# Patient Record
Sex: Male | Born: 1980 | Hispanic: No | Marital: Single
Health system: Southern US, Community
[De-identification: ages and names within clinical notes are randomized; demographics above are authoritative.]

## PROBLEM LIST (undated history)

## (undated) DIAGNOSIS — F640 Transsexualism: Secondary | ICD-10-CM

## (undated) DIAGNOSIS — M109 Gout, unspecified: Secondary | ICD-10-CM

## (undated) DIAGNOSIS — Z789 Other specified health status: Secondary | ICD-10-CM

## (undated) HISTORY — PX: INNER EAR SURGERY: SHX679

---

## 2001-09-16 ENCOUNTER — Emergency Department (HOSPITAL_COMMUNITY): Admission: EM | Admit: 2001-09-16 | Discharge: 2001-09-16 | Payer: Self-pay

## 2017-02-13 ENCOUNTER — Emergency Department (HOSPITAL_COMMUNITY)
Admission: EM | Admit: 2017-02-13 | Discharge: 2017-02-14 | Disposition: A | Discharge status: Home / Self Care | Payer: Self-pay | Attending: Emergency Medicine | Admitting: Emergency Medicine

## 2017-02-13 ENCOUNTER — Other Ambulatory Visit: Payer: Self-pay

## 2017-02-13 ENCOUNTER — Encounter (HOSPITAL_COMMUNITY): Payer: Self-pay | Admitting: Emergency Medicine

## 2017-02-13 DIAGNOSIS — M545 Low back pain: Secondary | ICD-10-CM | POA: Insufficient documentation

## 2017-02-13 DIAGNOSIS — M109 Gout, unspecified: Secondary | ICD-10-CM | POA: Insufficient documentation

## 2017-02-13 DIAGNOSIS — M5432 Sciatica, left side: Secondary | ICD-10-CM | POA: Insufficient documentation

## 2017-02-13 DIAGNOSIS — Z79899 Other long term (current) drug therapy: Secondary | ICD-10-CM | POA: Insufficient documentation

## 2017-02-13 LAB — URINALYSIS, ROUTINE W REFLEX MICROSCOPIC
Bilirubin Urine: NEGATIVE
Ketones, ur: 20 mg/dL — AB
RBC / HPF: NONE SEEN RBC/hpf (ref 0–5)
Specific Gravity, Urine: 1.009 (ref 1.005–1.030)

## 2017-02-13 NOTE — ED Triage Notes (Signed)
Pt reports his gout has flared up, both ankles/feet are swollen.  Pt reports pain is so great that he is unable to walk.  Pt reports that he has developed the lower back pain over the past 3days.  Urine has been very dark, no loss of bladder or bowels or pain upon urination.

## 2017-02-14 LAB — BASIC METABOLIC PANEL
Anion gap: 10 (ref 5–15)
BUN: 6 mg/dL (ref 6–20)
CO2: 23 mmol/L (ref 22–32)
Chloride: 97 mmol/L — ABNORMAL LOW (ref 101–111)
Glucose, Bld: 109 mg/dL — ABNORMAL HIGH (ref 65–99)
Potassium: 3.4 mmol/L — ABNORMAL LOW (ref 3.5–5.1)

## 2017-02-14 LAB — CBC WITH DIFFERENTIAL/PLATELET
Basophils Relative: 0 %
HCT: 36.6 % (ref 36.0–46.0)
Hemoglobin: 12.3 g/dL (ref 12.0–15.0)
Lymphocytes Relative: 10 %
Lymphs Abs: 0.6 10*3/uL — ABNORMAL LOW (ref 0.7–4.0)
MCH: 35.2 pg — ABNORMAL HIGH (ref 26.0–34.0)
MCHC: 33.6 g/dL (ref 30.0–36.0)
MCV: 104.9 fL — ABNORMAL HIGH (ref 78.0–100.0)
Monocytes Absolute: 1.1 10*3/uL — ABNORMAL HIGH (ref 0.1–1.0)
Monocytes Relative: 17 %
Neutro Abs: 4.9 10*3/uL (ref 1.7–7.7)
Neutrophils Relative %: 73 %
RDW: 13.4 % (ref 11.5–15.5)

## 2017-02-14 MED ORDER — COLCHICINE 0.6 MG PO TABS
1.2000 mg | ORAL_TABLET | Freq: Once | ORAL | Status: AC
  Administered 2017-02-14: 1.2 mg via ORAL
  Filled 2017-02-14: qty 2

## 2017-02-14 MED ORDER — OXYCODONE-ACETAMINOPHEN 5-325 MG PO TABS
1.0000 | ORAL_TABLET | Freq: Once | ORAL | Status: AC
  Administered 2017-02-14: 1 via ORAL
  Filled 2017-02-14: qty 1

## 2017-02-14 MED ORDER — OXYCODONE-ACETAMINOPHEN 5-325 MG PO TABS: 1.0000 | ORAL_TABLET | ORAL | 0 refills | Status: AC | PRN

## 2017-02-14 MED ORDER — COLCHICINE 0.6 MG PO TABS
0.6000 mg | ORAL_TABLET | Freq: Once | ORAL | Status: AC
  Administered 2017-02-14: 0.6 mg via ORAL
  Filled 2017-02-14: qty 1

## 2017-02-14 MED ORDER — SODIUM CHLORIDE 0.9 % IV BOLUS (SEPSIS): 1000.0000 mL | Freq: Once | INTRAVENOUS | Status: DC

## 2017-02-14 MED ORDER — INDOMETHACIN 25 MG PO CAPS: 25.0000 mg | ORAL_CAPSULE | Freq: Three times a day (TID) | ORAL | 0 refills | Status: AC | PRN

## 2017-02-14 NOTE — ED Provider Notes (Signed)
[facility] Provider [facility]   CSN: [id number] Arrival date & time: 02/13/17  2109     History   Chief Complaint Chief Complaint  Patient presents with   Gout   Back Pain    HPI Elijah BirkDevon L Weinfeld is a 36 y.o. male.  Patient with a history of gout presents with bilateral foot pain and redness consistent with previous episodes. No fever. He states that he is having trouble walking because of the pain. He also reports lower back pain that he feels is muscular from lying down so much over the last 3 days, and that his urine appears darker than usual without dysuria or hematuria.   The history is provided by the patient. No language interpreter was used.    History reviewed. No pertinent past medical history.  There are no active problems to display for this patient.   History reviewed. No pertinent surgical history.  OB History    No data available       Home Medications    Prior to Admission medications   Medication Sig Start Date End Date Taking? Authorizing Provider  testosterone cypionate (DEPOTESTOTERONE CYPIONATE) 100 MG/ML injection Inject 100 mg into the muscle every 14 (fourteen) days. For IM use only   Yes [provider]  indomethacin (INDOCIN) 25 MG capsule Take 1 capsule (25 mg total) by mouth 3 (three) times daily as needed. 02/14/17   Elpidio AnisUpstill, Avarey Yaeger, PA-C  oxyCODONE-acetaminophen (PERCOCET/ROXICET) 5-325 MG tablet Take 1 tablet by mouth every 4 (four) hours as needed for severe pain. 02/14/17   Elpidio AnisUpstill, Samary Shatz, PA-C    Family History No family history on file.  Social History Social History   Tobacco Use   Smoking status: Not on file  Substance Use Topics   Alcohol use: Not on file   Drug use: Not on file     Allergies   Penicillins   Review of Systems Review of Systems  Constitutional: Positive for activity change. Negative for fever.  Respiratory: Negative for shortness of breath.    Cardiovascular: Negative for chest pain.  Gastrointestinal: Negative for abdominal pain and nausea.  Genitourinary: Negative for dysuria and hematuria.  Musculoskeletal: Positive for back pain.  Skin: Positive for color change.  Neurological: Negative for numbness.     Physical Exam Updated Vital Signs BP (!) 134/103 (BP Location: Right Arm)    Pulse 91    Temp 98.2 F (36.8 C) (Oral)    Resp (!) 28    Ht 5\' 6"  (1.676 m)    Wt 66.7 kg (147 lb)    SpO2 99%    BMI 23.73 kg/m   Physical Exam  Constitutional: She appears well-developed and well-nourished.  HENT:  Head: Normocephalic.  Neck: Normal range of motion. Neck supple.  Cardiovascular: Normal rate and regular rhythm.  Pulmonary/Chest: Effort normal and breath sounds normal. She has no wheezes. She has no rales.  Abdominal: Soft. Bowel sounds are normal. There is no tenderness. There is no rebound and no guarding.  Genitourinary:  Genitourinary Comments: No CVA tenderness.   Musculoskeletal: Normal range of motion.  There is redness and tenderness to bilateral 1st MTP joints. Redness extends into R>L dorsal foot and ankles. No calf tenderness or swelling. Lower back is minimally tender without swelling with greater tenderness to left sciatic.   Neurological: She is alert. She displays normal reflexes.  Skin: Skin is warm and dry. No rash noted.  Psychiatric: She has a normal mood and  affect.     ED Treatments / Results  Labs (all labs ordered are listed, but only abnormal results are displayed) Labs Reviewed  URINALYSIS, ROUTINE W REFLEX MICROSCOPIC - Abnormal; Notable for the following components:      Result Value   Ketones, ur 20 (*)    Leukocytes, UA TRACE (*)    Squamous Epithelial / LPF 0-5 (*)    All other components within normal limits  BASIC METABOLIC PANEL - Abnormal; Notable for the following components:   Sodium 130 (*)    Potassium 3.4 (*)    Chloride 97 (*)    Glucose, Bld 109 (*)    All other  components within normal limits  CBC WITH DIFFERENTIAL/PLATELET - Abnormal; Notable for the following components:   RBC 3.49 (*)    MCV 104.9 (*)    MCH 35.2 (*)    Lymphs Abs 0.6 (*)    Monocytes Absolute 1.1 (*)    All other components within normal limits  URIC ACID    EKG  EKG Interpretation None       Radiology No results found.  Procedures Procedures (including critical care time)  Medications Ordered in ED Medications  sodium chloride 0.9 % bolus 1,000 mL (1,000 mLs Intravenous Refused 02/14/17 0050)  colchicine tablet 0.6 mg (not administered)  oxyCODONE-acetaminophen (PERCOCET/ROXICET) 5-325 MG per tablet 1 tablet (1 tablet Oral Given 02/14/17 0045)  colchicine tablet 1.2 mg (1.2 mg Oral Given 02/14/17 0045)     Initial Impression / Assessment and Plan / ED Course  I have reviewed the triage vital signs and the nursing notes.  Pertinent labs & imaging results that were available during my care of the patient were reviewed by me and considered in my medical decision making (see chart for details).     Patient with a history of gout presents with symptoms familiar to him as a flare up. No fever. He reports being unable to walk secondary to pain for several days.   Also has low back pain and left sciatica without neurologic deficits.   Labs are unremarkable, no evidence of infection. Pain is improved with Percocet. He is felt stable for discharge home. He is given colchicine for gout flare and states he feels better. Discharge home with indocin and percoct. Referrals for primary care provided.   Final Clinical Impressions(s) / ED Diagnoses   Final diagnoses:  Gout, arthritis  Sciatica of left side    ED Discharge Orders        Ordered    oxyCODONE-acetaminophen (PERCOCET/ROXICET) 5-325 MG tablet  Every 4 hours PRN     02/14/17 0146    indomethacin (INDOCIN) 25 MG capsule  3 times daily PRN     02/14/17 0146       Elpidio AnisUpstill, Adren Dollins, PA-C 02/20/17  [id number]    Glynn Octaveancour, Stephen, MD 02/26/17 1736

## 2017-05-25 ENCOUNTER — Encounter (HOSPITAL_COMMUNITY): Payer: Self-pay | Admitting: Emergency Medicine

## 2017-05-25 ENCOUNTER — Other Ambulatory Visit: Payer: Self-pay

## 2017-05-25 ENCOUNTER — Emergency Department (HOSPITAL_COMMUNITY): Payer: Self-pay

## 2017-05-25 DIAGNOSIS — R079 Chest pain, unspecified: Secondary | ICD-10-CM | POA: Insufficient documentation

## 2017-05-25 LAB — BASIC METABOLIC PANEL
Anion gap: 14 (ref 5–15)
BUN: 6 mg/dL (ref 6–20)
CHLORIDE: 105 mmol/L (ref 101–111)
CO2: 21 mmol/L — AB (ref 22–32)
Calcium: 9.5 mg/dL (ref 8.9–10.3)
Creatinine, Ser: 0.69 mg/dL (ref 0.61–1.24)
GFR calc Af Amer: >60 mL/min (ref >60)
GFR calc non Af Amer: >60 mL/min (ref >60)
Glucose, Bld: 96 mg/dL (ref 65–99)
POTASSIUM: 4.1 mmol/L (ref 3.5–5.1)
SODIUM: 140 mmol/L (ref 135–145)

## 2017-05-25 LAB — CBC
HEMATOCRIT: 42.3 % (ref 39.0–52.0)
Hemoglobin: 14.4 g/dL (ref 13.0–17.0)
MCH: 34.1 pg — AB (ref 26.0–34.0)
MCHC: 34 g/dL (ref 30.0–36.0)
MCV: 100.2 fL — AB (ref 78.0–100.0)
Platelets: 125 10*3/uL — ABNORMAL LOW (ref 150–400)
RBC: 4.22 MIL/uL (ref 4.22–5.81)
RDW: 12.6 % (ref 11.5–15.5)
WBC: 3.5 10*3/uL — AB (ref 4.0–10.5)

## 2017-05-25 LAB — I-STAT TROPONIN, ED: Troponin i, poc: 0 ng/mL (ref 0.00–0.08)

## 2017-05-25 NOTE — ED Triage Notes (Signed)
Pt reports chest pain that starts on the right side of his chest and over to his left side.  Denies n/v/d, dizziness or sob.  Pt reports his body is psoriasis has flared up to 70% of his body.  He is concerned he had a stroke??  Reports anxiety

## 2017-05-26 ENCOUNTER — Emergency Department (HOSPITAL_COMMUNITY)
Admission: EM | Admit: 2017-05-26 | Discharge: 2017-05-26 | Disposition: A | Discharge status: Home / Self Care | Payer: Self-pay | Attending: Emergency Medicine | Admitting: Emergency Medicine

## 2017-05-26 DIAGNOSIS — R079 Chest pain, unspecified: Secondary | ICD-10-CM

## 2017-05-26 HISTORY — DX: Gout, unspecified: M10.9

## 2017-05-26 NOTE — ED Provider Notes (Signed)
[facility] EMERGENCY DEPARTMENT Provider Note   CSN: [id number] Arrival date & time: 05/25/17  2011     History   Chief Complaint Chief Complaint  Patient presents with  . Chest Pain    HPI Louis Colon is a 37 y.o. male past medical history of gout and psoriasis presenting with 5 days of constant sharp initially right-sided than mild left-sided chest pain non- radiating.  Patient reports this being aggravated with deep breaths and movement of the chest wall.  Patient has not tried anything for this pain.  Reports soreness to his chest wall.  Denies any cough, fever, chills, nausea, vomiting. Denies any recent travel, prolonged immobilization, surgery, history of DVT/PE, hemoptysis, shortness of breath.  Patient denies any drug use but admits to alcohol use approximately 4 beers per day.  Patient is a non-smoker.   HPI  Past Medical History:  Diagnosis Date  . Gout     There are no active problems to display for this patient.   History reviewed. No pertinent surgical history.      Home Medications    Prior to Admission medications   Medication Sig Start Date End Date Taking? Authorizing Provider  testosterone cypionate (DEPOTESTOTERONE CYPIONATE) 100 MG/ML injection Inject 100 mg into the muscle every 14 (fourteen) days. For IM use only   Yes [provider]    Family History No family history on file.  Social History Social History   Tobacco Use  . Smoking status: Not on file  Substance Use Topics  . Alcohol use: Not on file  . Drug use: Not on file     Allergies   Penicillins   Review of Systems Review of Systems  Constitutional: Negative for chills, diaphoresis, fatigue and fever.  Respiratory: Negative for cough, choking, chest tightness, shortness of breath, wheezing and stridor.   Cardiovascular: Positive for chest pain.  Gastrointestinal: Negative for abdominal distention, nausea and vomiting.  Musculoskeletal:  Negative for myalgias, neck pain and neck stiffness.  Skin: Negative for color change and pallor.  Neurological: Negative for dizziness, weakness, light-headedness and headaches.     Physical Exam Updated Vital Signs BP 122/83 (BP Location: Left Arm)   Pulse 64   Temp (!) 97.5 F (36.4 C) (Oral)   Resp 16   SpO2 98%   Physical Exam  Constitutional: He is oriented to person, place, and time. He appears well-developed and well-nourished.  Non-toxic appearance. He does not appear ill. No distress.  Afebrile, nontoxic-appearing, lying comfortably in bed sleeping no acute distress.  HENT:  Head: Normocephalic and atraumatic.  Eyes: Conjunctivae are normal.  Neck: Normal range of motion. Neck supple.  Cardiovascular: Normal rate, regular rhythm, intact distal pulses and normal pulses.  No murmur heard. Pulmonary/Chest: Effort normal and breath sounds normal. No accessory muscle usage or stridor. No respiratory distress. He has no decreased breath sounds. He has no wheezes. He has no rhonchi. He has no rales.  Musculoskeletal: Normal range of motion. He exhibits no edema.       Right lower leg: Normal. He exhibits no tenderness and no edema.       Left lower leg: Normal. He exhibits no tenderness and no edema.  Neurological: He is alert and oriented to person, place, and time.  Skin: Skin is warm and dry. Rash noted.  Psoriatic rash most prominent on the lower extremities bilaterally.  Psychiatric: He has a normal mood and affect.  Nursing note and vitals reviewed.  ED Treatments / Results  Labs (all labs ordered are listed, but only abnormal results are displayed) Labs Reviewed  BASIC METABOLIC PANEL - Abnormal; Notable for the following components:      Result Value   CO2 21 (*)    All other components within normal limits  CBC - Abnormal; Notable for the following components:   WBC 3.5 (*)    MCV 100.2 (*)    MCH 34.1 (*)    Platelets 125 (*)    All other components  within normal limits  I-STAT TROPONIN, ED    EKG EKG Interpretation  Date/Time:  Monday May 25 2017 20:21:31 EDT Ventricular Rate:  86 PR Interval:  172 QRS Duration: 84 QT Interval:  352 QTC Calculation: 421 R Axis:   54 Text Interpretation:  Normal sinus rhythm Normal ECG No old tracing to compare Confirmed by Dione BoozeGlick, David ([id number]) on 05/26/2017 2:56:04 AM   Radiology Dg Chest 2 View  Result Date: 05/25/2017 CLINICAL DATA:  37 year old male with chest pain. EXAM: CHEST - 2 VIEW COMPARISON:  None. FINDINGS: The heart size and mediastinal contours are within normal limits. Both lungs are clear. The visualized skeletal structures are unremarkable. IMPRESSION: No active cardiopulmonary disease. Electronically Signed   By: Elgie CollardArash  Radparvar M.D.   On: 05/25/2017 21:24    Procedures Procedures (including critical care time)  Medications Ordered in ED Medications - No data to display   Initial Impression / Assessment and Plan / ED Course  I have reviewed the triage vital signs and the nursing notes.  Pertinent labs & imaging results that were available during my care of the patient were reviewed by me and considered in my medical decision making (see chart for details).     Patient presenting with 5 days of sharp chest pain that has moved around his chest but does not radiate. He was comfortable on my assessment. Normal vital signs, O2 sats 98% on room air. He is afebrile and nontoxic-appearing.  PERC negative  Normal EKG, negative troponin, normal chest x-ray Heart score: 1  Lungs are clear to auscultation bilaterally.  Will discharge home with close follow-up with PCP.  Patient was provided with resources to do so.  Discussed strict return precautions and advised to return to the emergency department if experiencing any new or worsening symptoms. Instructions were understood and patient agreed with discharge plan. Final Clinical Impressions(s) / ED Diagnoses   Final  diagnoses:  Nonspecific chest pain    ED Discharge Orders    None       Gregary CromerMitchell, Amerie Beaumont B, PA-C 05/26/17 [id number]    Dione BoozeGlick, David, MD 05/26/17 [phone]

## 2017-05-26 NOTE — Discharge Instructions (Signed)
As discussed, your

## 2017-10-09 ENCOUNTER — Encounter (HOSPITAL_COMMUNITY): Payer: Self-pay | Admitting: Emergency Medicine

## 2017-10-09 ENCOUNTER — Emergency Department (HOSPITAL_COMMUNITY): Payer: Self-pay

## 2017-10-09 ENCOUNTER — Emergency Department (HOSPITAL_COMMUNITY)
Admission: EM | Admit: 2017-10-09 | Discharge: 2017-10-09 | Disposition: A | Discharge status: Home / Self Care | Payer: Self-pay | Attending: Emergency Medicine | Admitting: Emergency Medicine

## 2017-10-09 DIAGNOSIS — F17210 Nicotine dependence, cigarettes, uncomplicated: Secondary | ICD-10-CM | POA: Insufficient documentation

## 2017-10-09 DIAGNOSIS — W1809XA Striking against other object with subsequent fall, initial encounter: Secondary | ICD-10-CM | POA: Insufficient documentation

## 2017-10-09 DIAGNOSIS — Y999 Unspecified external cause status: Secondary | ICD-10-CM | POA: Insufficient documentation

## 2017-10-09 DIAGNOSIS — Y929 Unspecified place or not applicable: Secondary | ICD-10-CM | POA: Insufficient documentation

## 2017-10-09 DIAGNOSIS — Z79899 Other long term (current) drug therapy: Secondary | ICD-10-CM | POA: Insufficient documentation

## 2017-10-09 DIAGNOSIS — F0781 Postconcussional syndrome: Secondary | ICD-10-CM | POA: Insufficient documentation

## 2017-10-09 DIAGNOSIS — S0990XA Unspecified injury of head, initial encounter: Secondary | ICD-10-CM | POA: Insufficient documentation

## 2017-10-09 DIAGNOSIS — Y939 Activity, unspecified: Secondary | ICD-10-CM | POA: Insufficient documentation

## 2017-10-09 HISTORY — DX: Other specified health status: Z78.9

## 2017-10-09 HISTORY — DX: Transsexualism: F64.0

## 2017-10-09 NOTE — ED Provider Notes (Addendum)
[facility]-EMERGENCY DEPT Provider Note   CSN: [id number] Arrival date & time: 10/09/17  1515     History   Chief Complaint Chief Complaint  Patient presents with  . Concussion  . Shaking    HPI Louis Colon is a 37 y.o. male.  The history is provided by the patient. No language interpreter was used.     37 year old transgender male presenting for evaluation of head injury.  Patient report 4 days ago he actually struck himself in the head with a plow from a tractor and had a syncopal episode.  He found himself on the ground for an unknown amount of time.  He did notice bleeding from his scalp and since then, patient states he has not been feeling right.  He denies any significant headache but report occasional bouts of nausea, feeling more emotional, occasionally have shaking endorsed patient to the right side of his body and feeling disequilibrium.  He takes ibuprofen on occasion with some improvement.  He is he per request of his family for further evaluation.  He denies any significant neck pain, vision changes, chest pain or trouble breathing.  Denies any drugs or alcohol use.  Past Medical History:  Diagnosis Date  . Gout   . Gout   . Transgender     There are no active problems to display for this patient.   Past Surgical History:  Procedure Laterality Date  . INNER EAR SURGERY          Home Medications    Prior to Admission medications   Medication Sig Start Date End Date Taking? Authorizing Provider  ibuprofen (ADVIL,MOTRIN) 200 MG tablet Take 200 mg by mouth every 6 (six) hours as needed for headache.   Yes [provider]  testosterone cypionate (DEPOTESTOTERONE CYPIONATE) 100 MG/ML injection Inject 100 mg into the muscle every 14 (fourteen) days. For IM use only   Yes [provider]    Family History No family history on file.  Social History Social History   Tobacco Use  . Smoking status: Light Tobacco  Smoker    Types: Cigarettes  . Smokeless tobacco: Never Used  Substance Use Topics  . Alcohol use: Not Currently  . Drug use: Not on file     Allergies   Penicillins   Review of Systems Review of Systems  All other systems reviewed and are negative.    Physical Exam Updated Vital Signs BP (!) 135/101 (BP Location: Left Arm)   Pulse (!) 102   Temp 98.6 F (37 C) (Oral)   Resp 18   Ht 5\' 6"  (1.676 m)   Wt 67.1 kg   SpO2 100%   BMI 23.89 kg/m   Physical Exam  Constitutional: He is oriented to person, place, and time. He appears well-developed and well-nourished. No distress.  HENT:  Head: Normocephalic and atraumatic.  No scalp tenderness, no signs of scalp injury, no hemotympanum, no septal hematoma, no malocclusion and no midface tenderness  Eyes: Pupils are equal, round, and reactive to light. Conjunctivae and EOM are normal.  Neck: Normal range of motion. Neck supple.  No midline cervical spine tenderness.  Neck with full range of motion.  Cardiovascular: Normal rate and regular rhythm.  Pulmonary/Chest: Effort normal and breath sounds normal.  Neurological: He is alert and oriented to person, place, and time.  Neurologic exam:  Speech clear, pupils equal round reactive to light, extraocular movements intact  Normal peripheral visual fields Cranial nerves III through  XII normal including no facial droop Follows commands, moves all extremities x4, normal strength to bilateral upper and lower extremities at all major muscle groups including grip Sensation normal to light touch Coordination intact, no limb ataxia, finger-nose-finger normal Rapid alternating movements normal No pronator drift Gait normal   Skin: No rash noted.  Psychiatric: He has a normal mood and affect.  Nursing note and vitals reviewed.    ED Treatments / Results  Labs (all labs ordered are listed, but only abnormal results are displayed) Labs Reviewed - No data to  display  EKG None  Radiology Ct Head Wo Contrast  Result Date: 10/09/2017 CLINICAL DATA:  37 y/o  M; head injury with loss of consciousness. EXAM: CT HEAD WITHOUT CONTRAST TECHNIQUE: Contiguous axial images were obtained from the base of the skull through the vertex without intravenous contrast. COMPARISON:  None. FINDINGS: Brain: No evidence of acute infarction, hemorrhage, hydrocephalus, extra-axial collection or mass lesion/mass effect. Vascular: No hyperdense vessel or unexpected calcification. Skull: Normal. Negative for fracture or focal lesion. Sinuses/Orbits: No acute finding. Other: None. IMPRESSION: No acute intracranial abnormality or calvarial fracture. Unremarkable CT of the head. Electronically Signed   By: Mitzi HansenLance  Furusawa-Stratton M.D.   On: 10/09/2017 17:11    Procedures Procedures (including critical care time)  Medications Ordered in ED Medications - No data to display   Initial Impression / Assessment and Plan / ED Course  I have reviewed the triage vital signs and the nursing notes.  Pertinent labs & imaging results that were available during my care of the patient were reviewed by me and considered in my medical decision making (see chart for details).     BP (!) 135/101 (BP Location: Left Arm)   Pulse (!) 102   Temp 98.6 F (37 C) (Oral)   Resp 18   Ht 5\' 6"  (1.676 m)   Wt 67.1 kg   SpO2 100%   BMI 23.89 kg/m    Final Clinical Impressions(s) / ED Diagnoses   Final diagnoses:  Post concussive syndrome    ED Discharge Orders    None     4:33 PM Patient report head injury with loss of consciousness several days prior here with symptoms consistent with a concussion.  Given complaints of occasional right-sided tremors and shakiness, will obtain head CT to rule out a slow bleed.  He does not have any focal neuro deficit on exam.  5:54 PM Head CT scan without any acute abnormalities.  Patient is reassured.  Patient diagnosed with postconcussive  symptoms.  Encourage rest and return precautions discussed.       Fayrene Helperran, Woodrow Dulski, PA-C 10/09/17 1759    Linwood DibblesKnapp, Jon, MD 10/09/17 2325

## 2017-10-09 NOTE — ED Triage Notes (Signed)
Pt reports that he was working on a tractor on Tuesday and was hit with plow and got knocked unconscious. Pt reports wife and mother feels pt needs to be seen due having shakes and "they say I am not my normal self and not right and we Googled on internet and felt best I come get another opinion."

## 2018-11-16 ENCOUNTER — Encounter (HOSPITAL_COMMUNITY): Payer: Self-pay | Admitting: *Deleted

## 2018-11-16 ENCOUNTER — Emergency Department (HOSPITAL_COMMUNITY): Payer: BC Managed Care – PPO

## 2018-11-16 ENCOUNTER — Emergency Department (HOSPITAL_COMMUNITY)
Admission: EM | Admit: 2018-11-16 | Discharge: 2018-11-16 | Disposition: A | Discharge status: Home / Self Care | Payer: BC Managed Care – PPO | Attending: Emergency Medicine | Admitting: Emergency Medicine

## 2018-11-16 ENCOUNTER — Other Ambulatory Visit: Payer: Self-pay

## 2018-11-16 DIAGNOSIS — M940 Chondrocostal junction syndrome [Tietze]: Secondary | ICD-10-CM | POA: Diagnosis not present

## 2018-11-16 DIAGNOSIS — R072 Precordial pain: Secondary | ICD-10-CM | POA: Insufficient documentation

## 2018-11-16 DIAGNOSIS — F17210 Nicotine dependence, cigarettes, uncomplicated: Secondary | ICD-10-CM | POA: Diagnosis not present

## 2018-11-16 DIAGNOSIS — R0789 Other chest pain: Secondary | ICD-10-CM | POA: Diagnosis present

## 2018-11-16 DIAGNOSIS — F640 Transsexualism: Secondary | ICD-10-CM | POA: Insufficient documentation

## 2018-11-16 LAB — CBC
HCT: 40.7 % (ref 39.0–52.0)
Hemoglobin: 14.3 g/dL (ref 13.0–17.0)
MCH: 36.2 pg — ABNORMAL HIGH (ref 26.0–34.0)
MCHC: 35.1 g/dL (ref 30.0–36.0)
MCV: 103 fL — ABNORMAL HIGH (ref 80.0–100.0)
Platelets: 132 10*3/uL — ABNORMAL LOW (ref 150–400)
RBC: 3.95 MIL/uL — ABNORMAL LOW (ref 4.22–5.81)
RDW: 12.1 % (ref 11.5–15.5)
WBC: 2.7 10*3/uL — ABNORMAL LOW (ref 4.0–10.5)
nRBC: 0 % (ref 0.0–0.2)

## 2018-11-16 LAB — BASIC METABOLIC PANEL
Anion gap: 12 (ref 5–15)
BUN: 6 mg/dL (ref 6–20)
CO2: 22 mmol/L (ref 22–32)
Calcium: 9.1 mg/dL (ref 8.9–10.3)
Chloride: 100 mmol/L (ref 98–111)
Creatinine, Ser: 0.51 mg/dL — ABNORMAL LOW (ref 0.61–1.24)
GFR calc Af Amer: >60 mL/min (ref >60)
GFR calc non Af Amer: >60 mL/min (ref >60)
Glucose, Bld: 106 mg/dL — ABNORMAL HIGH (ref 70–99)
Potassium: 3.9 mmol/L (ref 3.5–5.1)
Sodium: 134 mmol/L — ABNORMAL LOW (ref 135–145)

## 2018-11-16 LAB — TROPONIN I (HIGH SENSITIVITY)
Troponin I (High Sensitivity): 4 ng/L (ref <18)
Troponin I (High Sensitivity): 3 ng/L (ref <18)

## 2018-11-16 MED ORDER — ACETAMINOPHEN 325 MG PO TABS
650.0000 mg | ORAL_TABLET | Freq: Once | ORAL | Status: AC
  Administered 2018-11-16: 650 mg via ORAL
  Filled 2018-11-16: qty 2

## 2018-11-16 MED ORDER — SODIUM CHLORIDE 0.9% FLUSH: 3.0000 mL | Freq: Once | INTRAVENOUS | Status: DC

## 2018-11-16 MED ORDER — IBUPROFEN 400 MG PO TABS
400.0000 mg | ORAL_TABLET | Freq: Once | ORAL | Status: AC
Start: 2018-11-16 — End: 2018-11-16
  Administered 2018-11-16: 400 mg via ORAL
  Filled 2018-11-16: qty 1

## 2018-11-16 NOTE — ED Provider Notes (Signed)
[facility] Provider Note   CSN: [id number] Arrival date & time: 11/16/18  [id number]     History   Chief Complaint Chief Complaint  Patient presents with  . Chest Pain    HPI Louis Colon is a 38 y.o. male.     Patient c/o midline/mid chest pain at rest since last night. Symptoms acute onset, episodic, sharp, non radiating, peri-sternal in location, worse w certain movements and palpation chest wall. Notes hx same, previously also at rest, states possibly due to 'arthritis'. Denies recent chest wall injury or strain. No cough or uri symptoms. No fever or chills. No exertional cp. No associated sob, nv or diaphoresis. No pleuritic pain. No leg pain or swelling. No recent surgery, trauma, or immobility. No hx dvt or pe. No fam hx premature cad. Non smoker.   The history is provided by the patient.  Chest Pain Associated symptoms: no abdominal pain, no cough, no fever, no headache, no nausea, no shortness of breath and no vomiting     Past Medical History:  Diagnosis Date  . Gout   . Gout   . Transgender     There are no active problems to display for this patient.   Past Surgical History:  Procedure Laterality Date  . INNER EAR SURGERY          Home Medications    Prior to Admission medications   Medication Sig Start Date End Date Taking? Authorizing Provider  ibuprofen (ADVIL,MOTRIN) 200 MG tablet Take 200 mg by mouth every 6 (six) hours as needed for headache.    [provider]  testosterone cypionate (DEPOTESTOTERONE CYPIONATE) 100 MG/ML injection Inject 100 mg into the muscle every 14 (fourteen) days. For IM use only    [provider]    Family History No family history on file.  Social History Social History   Tobacco Use  . Smoking status: Light Tobacco Smoker    Types: Cigarettes  . Smokeless tobacco: Never Used  Substance Use Topics  . Alcohol use: Not Currently  . Drug use: Not on file      Allergies   Penicillins   Review of Systems Review of Systems  Constitutional: Negative for fever.  HENT: Negative for sore throat.   Eyes: Negative for redness.  Respiratory: Negative for cough and shortness of breath.   Cardiovascular: Positive for chest pain.  Gastrointestinal: Negative for abdominal pain, nausea and vomiting.  Genitourinary: Negative for flank pain.  Musculoskeletal: Negative for neck pain.  Skin: Negative for rash.  Neurological: Negative for headaches.  Hematological: Does not bruise/bleed easily.  Psychiatric/Behavioral: Negative for confusion.     Physical Exam Updated Vital Signs BP 130/85 (BP Location: Right Arm)   Pulse 62   Temp 97.6 F (36.4 C) (Oral)   Resp (!) 22   SpO2 99%   Physical Exam Vitals signs and nursing note reviewed.  Constitutional:      Appearance: Normal appearance. He is well-developed.  HENT:     Head: Atraumatic.     Nose: Nose normal.     Mouth/Throat:     Mouth: Mucous membranes are moist.     Pharynx: Oropharynx is clear.  Eyes:     General: No scleral icterus.    Conjunctiva/sclera: Conjunctivae normal.  Neck:     Musculoskeletal: Normal range of motion and neck supple. No neck rigidity.     Trachea: No tracheal deviation.  Cardiovascular:     Rate and Rhythm:  Normal rate and regular rhythm.     Pulses: Normal pulses.     Heart sounds: Normal heart sounds. No murmur. No friction rub. No gallop.   Pulmonary:     Effort: Pulmonary effort is normal. No accessory muscle usage or respiratory distress.     Breath sounds: Normal breath sounds.     Comments: +chest wall tenderness, reproducing symptoms.  Chest:     Chest wall: Tenderness present.  Abdominal:     General: Bowel sounds are normal. There is no distension.     Palpations: Abdomen is soft.     Tenderness: There is no abdominal tenderness. There is no guarding.  Genitourinary:    Comments: No cva tenderness. Musculoskeletal:        General: No  swelling or tenderness.     Right lower leg: No edema.     Left lower leg: No edema.  Skin:    General: Skin is warm and dry.     Findings: No rash.  Neurological:     Mental Status: He is alert.     Comments: Alert, speech clear. Steady gait.   Psychiatric:        Mood and Affect: Mood normal.      ED Treatments / Results  Labs (all labs ordered are listed, but only abnormal results are displayed) Results for orders placed or performed during the hospital encounter of 14/43/15  Basic metabolic panel  Result Value Ref Range   Sodium 134 (L) 135 - 145 mmol/L   Potassium 3.9 3.5 - 5.1 mmol/L   Chloride 100 98 - 111 mmol/L   CO2 22 22 - 32 mmol/L   Glucose, Bld 106 (H) 70 - 99 mg/dL   BUN 6 6 - 20 mg/dL   Creatinine, Ser 0.51 (L) 0.61 - 1.24 mg/dL   Calcium 9.1 8.9 - 10.3 mg/dL   GFR calc non Af Amer >60 >60 mL/min   GFR calc Af Amer >60 >60 mL/min   Anion gap 12 5 - 15  CBC  Result Value Ref Range   WBC 2.7 (L) 4.0 - 10.5 K/uL   RBC 3.95 (L) 4.22 - 5.81 MIL/uL   Hemoglobin 14.3 13.0 - 17.0 g/dL   HCT 40.7 39.0 - 52.0 %   MCV 103.0 (H) 80.0 - 100.0 fL   MCH 36.2 (H) 26.0 - 34.0 pg   MCHC 35.1 30.0 - 36.0 g/dL   RDW 12.1 11.5 - 15.5 %   Platelets 132 (L) 150 - 400 K/uL   nRBC 0.0 0.0 - 0.2 %  Troponin I (High Sensitivity)  Result Value Ref Range   Troponin I (High Sensitivity) 4 <18 ng/L  Troponin I (High Sensitivity)  Result Value Ref Range   Troponin I (High Sensitivity) 3 <18 ng/L   Dg Chest 2 View  Result Date: 11/16/2018 CLINICAL DATA:  Chest pain. EXAM: CHEST - 2 VIEW COMPARISON:  05/26/2017. FINDINGS: Mediastinum hilar structures normal. Borderline cardiomegaly with mild pulmonary venous congestion. No focal infiltrate. No pleural effusion or pneumothorax. Mild thoracolumbar spine scoliosis. IMPRESSION: 1.  Borderline cardiomegaly with mild pulmonary venous congestion. 2.  No acute pulmonary infiltrate. Electronically Signed   By: Marcello Moores  Register   On:  11/16/2018 06:43    EKG EKG Interpretation  Date/Time:  Tuesday November 16 2018 06:03:28 EDT Ventricular Rate:  59 PR Interval:  190 QRS Duration: 90 QT Interval:  422 QTC Calculation: 417 R Axis:   65 Text Interpretation:  Sinus bradycardia Otherwise normal ECG  Confirmed by Cathren Laine ([id number]) on 11/16/2018 8:24:54 AM   Radiology Dg Chest 2 View  Result Date: 11/16/2018 CLINICAL DATA:  Chest pain. EXAM: CHEST - 2 VIEW COMPARISON:  05/26/2017. FINDINGS: Mediastinum hilar structures normal. Borderline cardiomegaly with mild pulmonary venous congestion. No focal infiltrate. No pleural effusion or pneumothorax. Mild thoracolumbar spine scoliosis. IMPRESSION: 1.  Borderline cardiomegaly with mild pulmonary venous congestion. 2.  No acute pulmonary infiltrate. Electronically Signed   By: Maisie Fus  Register   On: 11/16/2018 06:43    Procedures Procedures (including critical care time)  Medications Ordered in ED Medications  sodium chloride flush (NS) 0.9 % injection 3 mL (has no administration in time range)  ibuprofen (ADVIL) tablet 400 mg (has no administration in time range)  acetaminophen (TYLENOL) tablet 650 mg (650 mg Oral Given 11/16/18 0813)     Initial Impression / Assessment and Plan / ED Course  I have reviewed the triage vital signs and the nursing notes.  Pertinent labs & imaging results that were available during my care of the patient were reviewed by me and considered in my medical decision making (see chart for details).  Labs sent. Ecg. Cxr.   Reviewed nursing notes and prior charts for additional history.   CXR reviewed by me - no pna.  Labs reviewed by me - initial trop normal.  Exam appears most c/w musculoskeletal/chest wall pain. Motrin po.  Delta trop is normal.  Patient currently appears stable for d/c.  Rec pcp f/u.  Return precautions provided.     Final Clinical Impressions(s) / ED Diagnoses   Final diagnoses:  None    ED Discharge  Orders    None       Cathren Laine, MD 11/16/18 [phone]

## 2018-11-16 NOTE — ED Notes (Signed)
Patient verbalized understanding of discharge instructions and denies any further needs or questions at this time. VS stable. Patient ambulatory with steady gait. Work note provided for patient per request.

## 2018-11-16 NOTE — ED Notes (Signed)
Pt updated on estimated wait time, repeat trop being collected at this time

## 2018-11-16 NOTE — ED Triage Notes (Signed)
To ED for eval of left side cp that woke pt out of sleep. States he felt fine when he went to sleep. States he has had cp in the past but without dx. Appears in nad. Pain is described now as a pressure and stabbing. No sob. No nausea or vomiting.

## 2018-11-16 NOTE — Discharge Instructions (Signed)
It was our pleasure to provide your ER care today - we hope that you feel better.  Your heart tests/troponin x 2 appear good/normal.   Take ibuprofen or naprosyn as need for pain.  Follow up with primary care doctor in the next few weeks.   Return to ER if worse, new symptoms, fevers, increased trouble breathing, recurrent/persistent chest pain, or other concern.

## 2019-05-02 ENCOUNTER — Ambulatory Visit: Payer: BC Managed Care – PPO | Admitting: Osteopathic Medicine

## 2020-02-02 IMAGING — CR DG CHEST 2V
2 series · 2 of 2 positions shown · non-contrast
Comparison: 05/26/2017.

CLINICAL DATA: Chest pain.

EXAM:
CHEST - 2 VIEW

[chest pa]
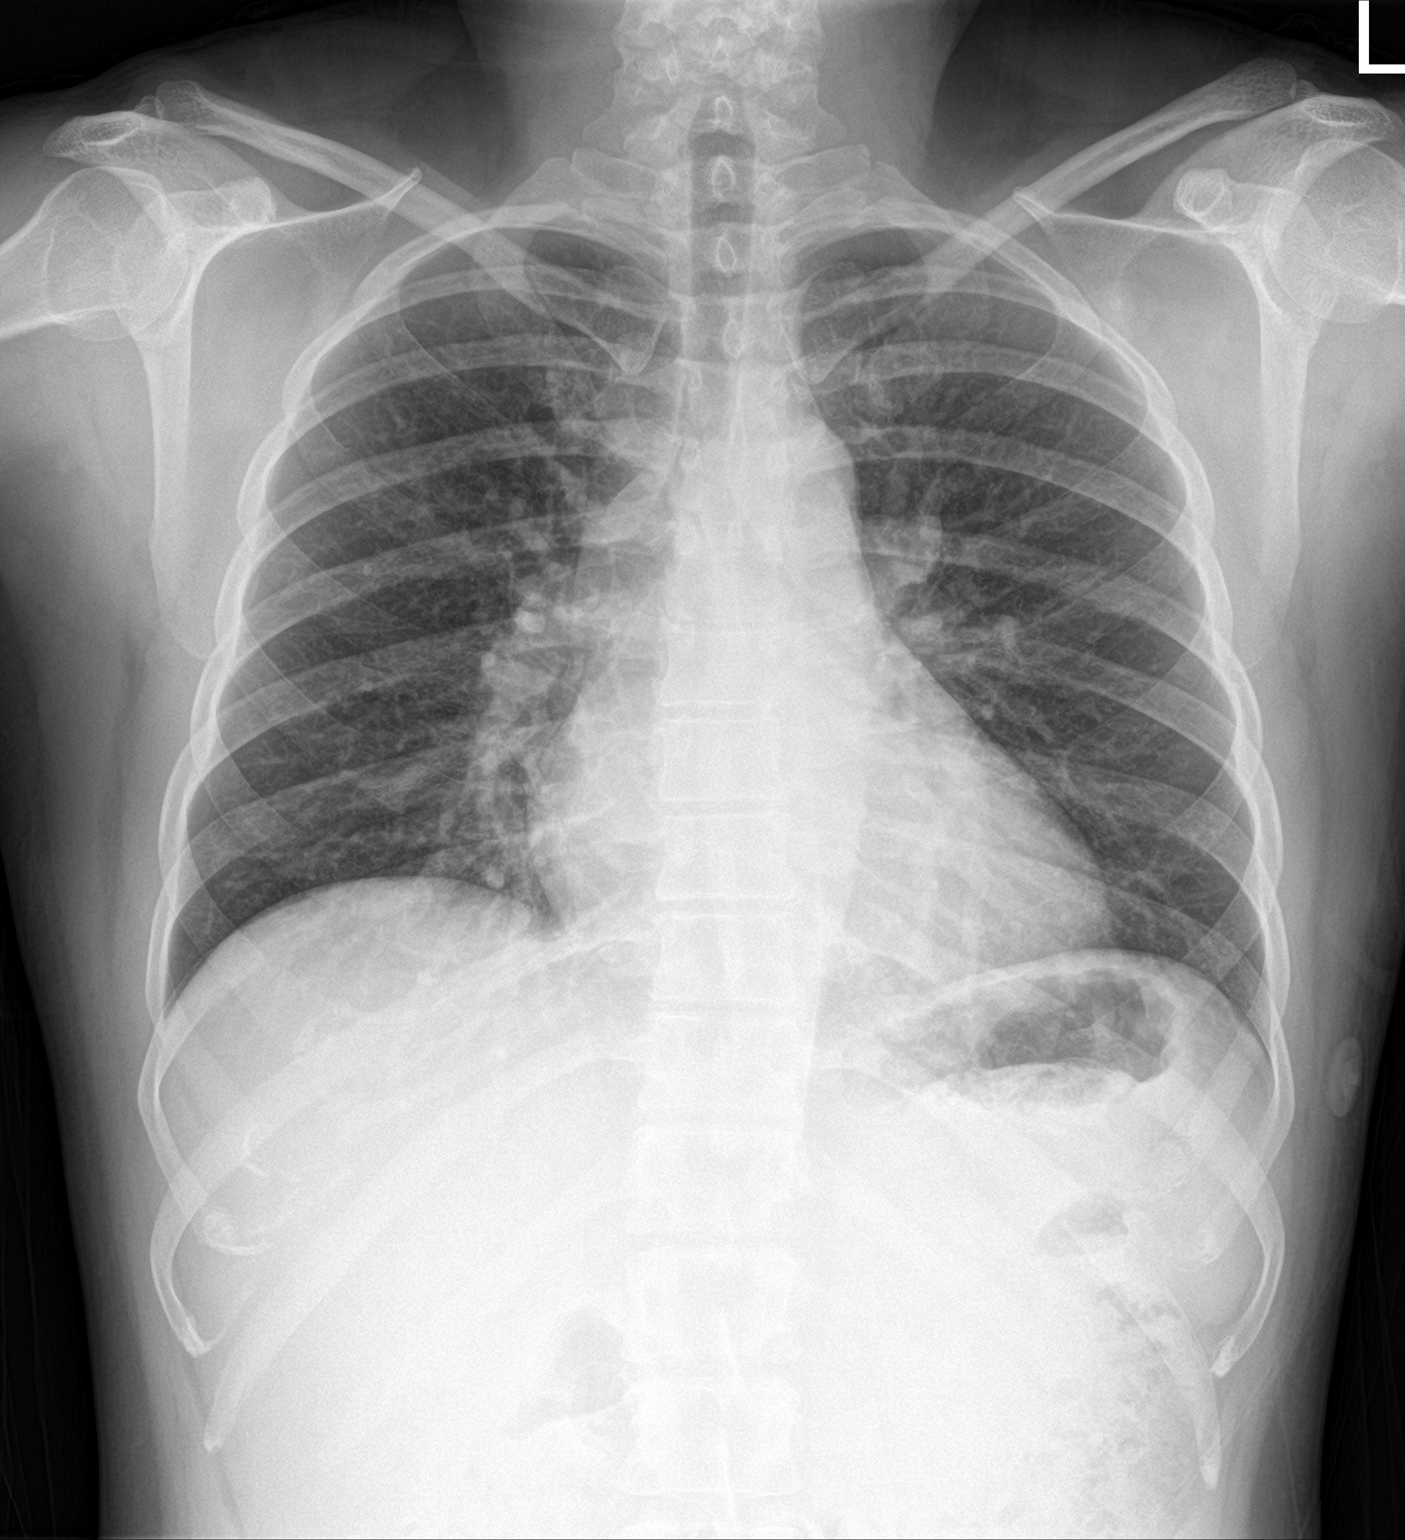

[chest lat]
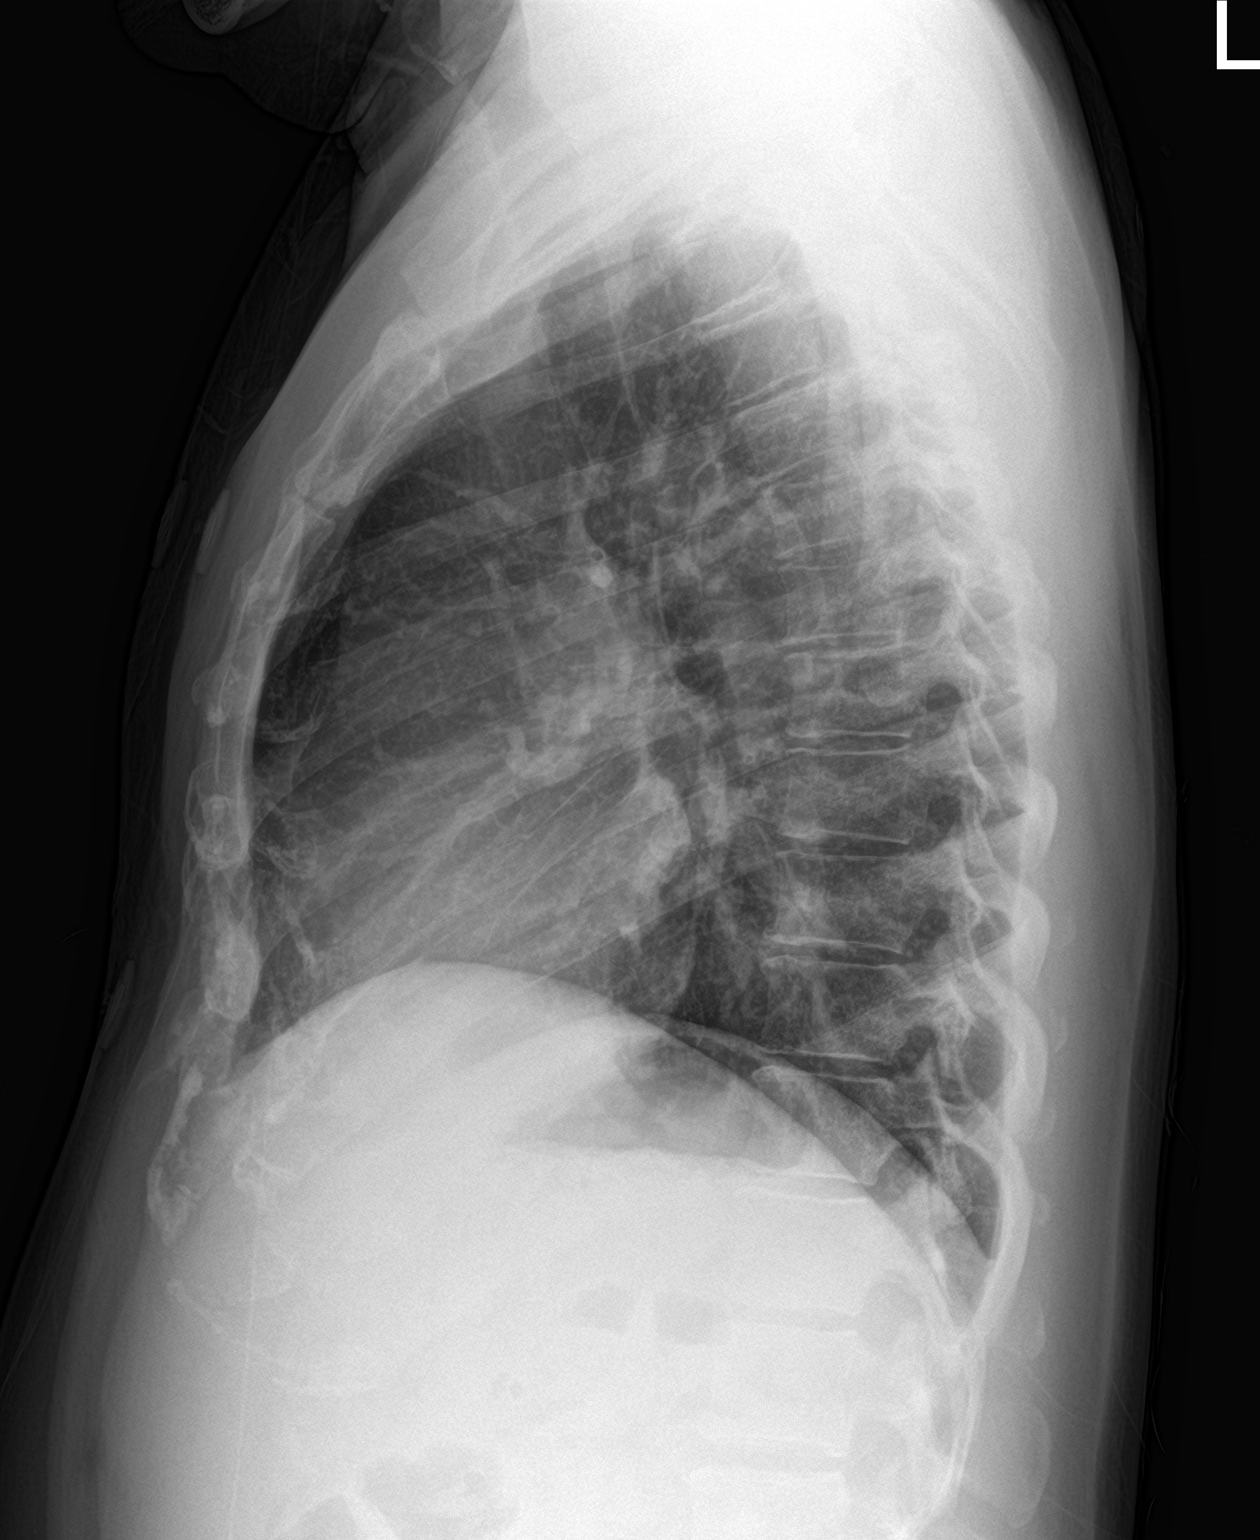

[2 of 2 positions shown; findings below may reference images not displayed]

FINDINGS: Mediastinum hilar structures normal. Borderline cardiomegaly with
mild pulmonary venous congestion. No focal infiltrate. No pleural
effusion or pneumothorax. Mild thoracolumbar spine scoliosis.
IMPRESSION: 1.  Borderline cardiomegaly with mild pulmonary venous congestion.

2.  No acute pulmonary infiltrate.
